# Patient Record
Sex: Female | Born: 2000 | Race: White | Hispanic: No | Marital: Single | Smoking: Never smoker
Health system: Southern US, Community
[De-identification: ages and names within clinical notes are randomized; demographics above are authoritative.]

## PROBLEM LIST (undated history)

## (undated) DIAGNOSIS — J45909 Unspecified asthma, uncomplicated: Secondary | ICD-10-CM

## (undated) HISTORY — PX: UPPER GASTROINTESTINAL ENDOSCOPY: SHX188

---
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
## 2020-01-20 DIAGNOSIS — F902 Attention-deficit hyperactivity disorder, combined type: Secondary | ICD-10-CM | POA: Diagnosis not present

## 2020-01-20 DIAGNOSIS — F411 Generalized anxiety disorder: Secondary | ICD-10-CM | POA: Diagnosis not present

## 2020-01-20 DIAGNOSIS — F331 Major depressive disorder, recurrent, moderate: Secondary | ICD-10-CM | POA: Diagnosis not present

## 2020-02-13 DIAGNOSIS — F902 Attention-deficit hyperactivity disorder, combined type: Secondary | ICD-10-CM | POA: Diagnosis not present

## 2020-02-13 DIAGNOSIS — F4323 Adjustment disorder with mixed anxiety and depressed mood: Secondary | ICD-10-CM | POA: Diagnosis not present

## 2020-02-13 DIAGNOSIS — F331 Major depressive disorder, recurrent, moderate: Secondary | ICD-10-CM | POA: Diagnosis not present

## 2020-02-13 DIAGNOSIS — F411 Generalized anxiety disorder: Secondary | ICD-10-CM | POA: Diagnosis not present

## 2020-03-13 DIAGNOSIS — F411 Generalized anxiety disorder: Secondary | ICD-10-CM | POA: Diagnosis not present

## 2020-03-13 DIAGNOSIS — F331 Major depressive disorder, recurrent, moderate: Secondary | ICD-10-CM | POA: Diagnosis not present

## 2020-03-13 DIAGNOSIS — F902 Attention-deficit hyperactivity disorder, combined type: Secondary | ICD-10-CM | POA: Diagnosis not present

## 2020-04-21 ENCOUNTER — Ambulatory Visit: Payer: BC Managed Care – PPO | Admitting: Family Medicine

## 2020-04-27 ENCOUNTER — Ambulatory Visit: Payer: Self-pay | Admitting: Family Medicine

## 2020-05-15 DIAGNOSIS — F331 Major depressive disorder, recurrent, moderate: Secondary | ICD-10-CM | POA: Diagnosis not present

## 2020-05-15 DIAGNOSIS — F902 Attention-deficit hyperactivity disorder, combined type: Secondary | ICD-10-CM | POA: Diagnosis not present

## 2020-05-15 DIAGNOSIS — F411 Generalized anxiety disorder: Secondary | ICD-10-CM | POA: Diagnosis not present

## 2020-07-07 DIAGNOSIS — F411 Generalized anxiety disorder: Secondary | ICD-10-CM | POA: Diagnosis not present

## 2020-07-07 DIAGNOSIS — F331 Major depressive disorder, recurrent, moderate: Secondary | ICD-10-CM | POA: Diagnosis not present

## 2020-07-07 DIAGNOSIS — F902 Attention-deficit hyperactivity disorder, combined type: Secondary | ICD-10-CM | POA: Diagnosis not present

## 2020-10-15 DIAGNOSIS — F331 Major depressive disorder, recurrent, moderate: Secondary | ICD-10-CM | POA: Diagnosis not present

## 2020-10-15 DIAGNOSIS — F902 Attention-deficit hyperactivity disorder, combined type: Secondary | ICD-10-CM | POA: Diagnosis not present

## 2020-10-15 DIAGNOSIS — F411 Generalized anxiety disorder: Secondary | ICD-10-CM | POA: Diagnosis not present

## 2020-10-28 ENCOUNTER — Other Ambulatory Visit: Payer: Self-pay | Admitting: *Deleted

## 2020-10-28 DIAGNOSIS — F419 Anxiety disorder, unspecified: Secondary | ICD-10-CM

## 2020-10-28 MED ORDER — CITALOPRAM HYDROBROMIDE 40 MG PO TABS: 40.0000 mg | ORAL_TABLET | Freq: Every day | ORAL | 1 refills | Status: AC

## 2020-12-08 DIAGNOSIS — L282 Other prurigo: Secondary | ICD-10-CM | POA: Diagnosis not present

## 2021-01-11 DIAGNOSIS — F411 Generalized anxiety disorder: Secondary | ICD-10-CM | POA: Diagnosis not present

## 2021-01-11 DIAGNOSIS — F331 Major depressive disorder, recurrent, moderate: Secondary | ICD-10-CM | POA: Diagnosis not present

## 2021-01-11 DIAGNOSIS — F902 Attention-deficit hyperactivity disorder, combined type: Secondary | ICD-10-CM | POA: Diagnosis not present

## 2021-03-02 DIAGNOSIS — F902 Attention-deficit hyperactivity disorder, combined type: Secondary | ICD-10-CM | POA: Diagnosis not present

## 2021-03-16 DIAGNOSIS — F902 Attention-deficit hyperactivity disorder, combined type: Secondary | ICD-10-CM | POA: Diagnosis not present

## 2021-03-30 DIAGNOSIS — F902 Attention-deficit hyperactivity disorder, combined type: Secondary | ICD-10-CM | POA: Diagnosis not present

## 2021-04-09 DIAGNOSIS — F411 Generalized anxiety disorder: Secondary | ICD-10-CM | POA: Diagnosis not present

## 2021-04-09 DIAGNOSIS — F902 Attention-deficit hyperactivity disorder, combined type: Secondary | ICD-10-CM | POA: Diagnosis not present

## 2021-04-09 DIAGNOSIS — F331 Major depressive disorder, recurrent, moderate: Secondary | ICD-10-CM | POA: Diagnosis not present

## 2021-04-14 DIAGNOSIS — F902 Attention-deficit hyperactivity disorder, combined type: Secondary | ICD-10-CM | POA: Diagnosis not present

## 2021-05-11 DIAGNOSIS — F902 Attention-deficit hyperactivity disorder, combined type: Secondary | ICD-10-CM | POA: Diagnosis not present

## 2021-05-25 DIAGNOSIS — F902 Attention-deficit hyperactivity disorder, combined type: Secondary | ICD-10-CM | POA: Diagnosis not present

## 2021-06-08 DIAGNOSIS — F902 Attention-deficit hyperactivity disorder, combined type: Secondary | ICD-10-CM | POA: Diagnosis not present

## 2021-06-22 DIAGNOSIS — F902 Attention-deficit hyperactivity disorder, combined type: Secondary | ICD-10-CM | POA: Diagnosis not present

## 2021-07-07 DIAGNOSIS — F331 Major depressive disorder, recurrent, moderate: Secondary | ICD-10-CM | POA: Diagnosis not present

## 2021-07-07 DIAGNOSIS — F902 Attention-deficit hyperactivity disorder, combined type: Secondary | ICD-10-CM | POA: Diagnosis not present

## 2021-07-07 DIAGNOSIS — F411 Generalized anxiety disorder: Secondary | ICD-10-CM | POA: Diagnosis not present

## 2021-07-13 DIAGNOSIS — F902 Attention-deficit hyperactivity disorder, combined type: Secondary | ICD-10-CM | POA: Diagnosis not present

## 2021-07-27 DIAGNOSIS — F902 Attention-deficit hyperactivity disorder, combined type: Secondary | ICD-10-CM | POA: Diagnosis not present

## 2021-08-10 DIAGNOSIS — F902 Attention-deficit hyperactivity disorder, combined type: Secondary | ICD-10-CM | POA: Diagnosis not present

## 2021-08-24 DIAGNOSIS — F902 Attention-deficit hyperactivity disorder, combined type: Secondary | ICD-10-CM | POA: Diagnosis not present

## 2021-09-21 DIAGNOSIS — F902 Attention-deficit hyperactivity disorder, combined type: Secondary | ICD-10-CM | POA: Diagnosis not present

## 2021-10-04 DIAGNOSIS — F331 Major depressive disorder, recurrent, moderate: Secondary | ICD-10-CM | POA: Diagnosis not present

## 2021-10-04 DIAGNOSIS — F411 Generalized anxiety disorder: Secondary | ICD-10-CM | POA: Diagnosis not present

## 2021-10-04 DIAGNOSIS — F902 Attention-deficit hyperactivity disorder, combined type: Secondary | ICD-10-CM | POA: Diagnosis not present

## 2021-11-08 DIAGNOSIS — F902 Attention-deficit hyperactivity disorder, combined type: Secondary | ICD-10-CM | POA: Diagnosis not present

## 2021-11-22 DIAGNOSIS — F902 Attention-deficit hyperactivity disorder, combined type: Secondary | ICD-10-CM | POA: Diagnosis not present

## 2021-12-06 DIAGNOSIS — F902 Attention-deficit hyperactivity disorder, combined type: Secondary | ICD-10-CM | POA: Diagnosis not present

## 2021-12-21 DIAGNOSIS — F902 Attention-deficit hyperactivity disorder, combined type: Secondary | ICD-10-CM | POA: Diagnosis not present

## 2021-12-31 DIAGNOSIS — F331 Major depressive disorder, recurrent, moderate: Secondary | ICD-10-CM | POA: Diagnosis not present

## 2021-12-31 DIAGNOSIS — F902 Attention-deficit hyperactivity disorder, combined type: Secondary | ICD-10-CM | POA: Diagnosis not present

## 2021-12-31 DIAGNOSIS — F411 Generalized anxiety disorder: Secondary | ICD-10-CM | POA: Diagnosis not present

## 2022-01-04 DIAGNOSIS — F902 Attention-deficit hyperactivity disorder, combined type: Secondary | ICD-10-CM | POA: Diagnosis not present

## 2022-01-18 DIAGNOSIS — F902 Attention-deficit hyperactivity disorder, combined type: Secondary | ICD-10-CM | POA: Diagnosis not present

## 2022-02-03 DIAGNOSIS — F902 Attention-deficit hyperactivity disorder, combined type: Secondary | ICD-10-CM | POA: Diagnosis not present

## 2022-02-17 DIAGNOSIS — F902 Attention-deficit hyperactivity disorder, combined type: Secondary | ICD-10-CM | POA: Diagnosis not present

## 2022-03-03 DIAGNOSIS — F902 Attention-deficit hyperactivity disorder, combined type: Secondary | ICD-10-CM | POA: Diagnosis not present

## 2022-03-15 DIAGNOSIS — F902 Attention-deficit hyperactivity disorder, combined type: Secondary | ICD-10-CM | POA: Diagnosis not present

## 2022-03-29 DIAGNOSIS — F902 Attention-deficit hyperactivity disorder, combined type: Secondary | ICD-10-CM | POA: Diagnosis not present

## 2022-04-01 DIAGNOSIS — F902 Attention-deficit hyperactivity disorder, combined type: Secondary | ICD-10-CM | POA: Diagnosis not present

## 2022-04-01 DIAGNOSIS — F331 Major depressive disorder, recurrent, moderate: Secondary | ICD-10-CM | POA: Diagnosis not present

## 2022-04-01 DIAGNOSIS — F411 Generalized anxiety disorder: Secondary | ICD-10-CM | POA: Diagnosis not present

## 2022-04-12 DIAGNOSIS — F902 Attention-deficit hyperactivity disorder, combined type: Secondary | ICD-10-CM | POA: Diagnosis not present

## 2023-11-13 ENCOUNTER — Telehealth: Payer: Self-pay | Admitting: Family Medicine

## 2023-11-13 NOTE — Telephone Encounter (Unsigned)
 Copied from CRM [id number]. Topic: Clinical - Medication Refill >> Nov 13, 2023 11:32 AM Zane F wrote: Patient is calling in after being informed by pharmacy that they have made several attempts to refill the prescription with no success. Patient is now out of the prescription. Patient's preferred pharmacy has changed due to her now being in a different location for school. Patient is aware that she has not been seen by provider in quite some time. If unable to refill prescription please call patient at [phone].  Medication: citalopram  (CELEXA ) 40 MG tablet   Has the patient contacted their pharmacy? Yes   This is the patient's preferred pharmacy:   [facility] Store ID: [phone] [address], [place], [place] [address] Phone: [phone]   Is this the correct pharmacy for this prescription? Yes   Has the prescription been filled recently? No  Is the patient out of the medication? Yes  Has the patient been seen for an appointment in the last year OR does the patient have an upcoming appointment? No  Can we respond through MyChart? Yes  Agent: Please be advised that Rx refills may take up to 3 business days. We ask that you follow-up with your pharmacy.
